# Patient Record
Sex: Female | Born: 1965 | Race: White | Hispanic: No | Marital: Married | Smoking: Current every day smoker
Health system: Southern US, Community
[De-identification: ages and names within clinical notes are randomized; demographics above are authoritative.]

## PROBLEM LIST (undated history)

## (undated) HISTORY — PX: TIBIA FRACTURE SURGERY: SHX806

---

## 2017-10-09 ENCOUNTER — Emergency Department (HOSPITAL_COMMUNITY)
Admission: EM | Admit: 2017-10-09 | Discharge: 2017-10-09 | Disposition: A | Discharge status: Home / Self Care | Payer: Medicaid Other | Attending: Emergency Medicine | Admitting: Emergency Medicine

## 2017-10-09 ENCOUNTER — Emergency Department (HOSPITAL_COMMUNITY): Payer: Medicaid Other

## 2017-10-09 ENCOUNTER — Encounter (HOSPITAL_COMMUNITY): Payer: Self-pay

## 2017-10-09 DIAGNOSIS — Y929 Unspecified place or not applicable: Secondary | ICD-10-CM | POA: Diagnosis not present

## 2017-10-09 DIAGNOSIS — W1789XA Other fall from one level to another, initial encounter: Secondary | ICD-10-CM | POA: Insufficient documentation

## 2017-10-09 DIAGNOSIS — S99912A Unspecified injury of left ankle, initial encounter: Secondary | ICD-10-CM | POA: Diagnosis present

## 2017-10-09 DIAGNOSIS — Y998 Other external cause status: Secondary | ICD-10-CM | POA: Insufficient documentation

## 2017-10-09 DIAGNOSIS — Y9389 Activity, other specified: Secondary | ICD-10-CM | POA: Diagnosis not present

## 2017-10-09 DIAGNOSIS — S82832A Other fracture of upper and lower end of left fibula, initial encounter for closed fracture: Secondary | ICD-10-CM | POA: Diagnosis not present

## 2017-10-09 DIAGNOSIS — F17200 Nicotine dependence, unspecified, uncomplicated: Secondary | ICD-10-CM | POA: Diagnosis not present

## 2017-10-09 DIAGNOSIS — S82892A Other fracture of left lower leg, initial encounter for closed fracture: Secondary | ICD-10-CM

## 2017-10-09 MED ORDER — HYDROCODONE-ACETAMINOPHEN 5-325 MG PO TABS
1.0000 | ORAL_TABLET | Freq: Once | ORAL | Status: AC
  Administered 2017-10-09: 1 via ORAL
  Filled 2017-10-09: qty 1

## 2017-10-09 MED ORDER — HYDROCODONE-ACETAMINOPHEN 5-325 MG PO TABS: 2.0000 | ORAL_TABLET | ORAL | 0 refills | Status: AC | PRN

## 2017-10-09 NOTE — ED Provider Notes (Signed)
[facility] Provider Note   CSN: [id number] Arrival date & time: 10/09/17  1745     History   Chief Complaint Chief Complaint  Patient presents with  . Ankle Injury    HPI Paula DammeMichelle Ferrell is a 52 y.o. female.  52 year old female without significant prior medical history presents with complaint of left ankle pain.  Patient reports that earlier this afternoon she jumped from a fence and landed hard on the left ankle.  She felt a pop and immediate pain to the left lateral ankle.  She denies any other injury.  She has not been able to ambulate on the ankle.  She denies prior fractures to the left ankle.  She denies numbness or tingling to the left leg.  She denies head injury, neck pain, chest pain, shortness of breath, or other complaint.  The history is provided by the patient.  Ankle Injury  This is a new problem. The current episode started 3 to 5 hours ago. The problem occurs constantly. The problem has not changed since onset.Pertinent negatives include no chest pain, no abdominal pain, no headaches and no shortness of breath. The symptoms are aggravated by standing. Nothing relieves the symptoms. She has tried nothing for the symptoms.    History reviewed. No pertinent past medical history.  There are no active problems to display for this patient.   History reviewed. No pertinent surgical history.   OB History   None      Home Medications    Prior to Admission medications   Not on File    Family History No family history on file.  Social History Social History   Tobacco Use  . Smoking status: Current Every Day Smoker  . Smokeless tobacco: Never Used  Substance Use Topics  . Alcohol use: Not on file  . Drug use: Not on file     Allergies   Patient has no known allergies.   Review of Systems Review of Systems  Respiratory: Negative for shortness of breath.   Cardiovascular: Negative for chest pain.    Gastrointestinal: Negative for abdominal pain.  Musculoskeletal:       Left ankle pain  Neurological: Negative for headaches.  All other systems reviewed and are negative.    Physical Exam Updated Vital Signs BP 130/74 (BP Location: Left Arm)   Pulse 73   Temp 98.5 F (36.9 C) (Oral)   Resp 20   SpO2 97%   Physical Exam  Constitutional: She is oriented to person, place, and time. She appears well-developed and well-nourished. No distress.  HENT:  Head: Normocephalic and atraumatic.  Mouth/Throat: Oropharynx is clear and moist.  Eyes: Pupils are equal, round, and reactive to light. Conjunctivae and EOM are normal.  Neck: Normal range of motion. Neck supple.  Cardiovascular: Normal rate, regular rhythm and normal heart sounds.  Pulmonary/Chest: Effort normal and breath sounds normal. No respiratory distress.  Abdominal: Soft. She exhibits no distension. There is no tenderness.  Musculoskeletal: Normal range of motion. She exhibits no edema or deformity.  Tenderness to the left lateral ankle at the lateral malleolus.  Distal left lower extremity is neurovascularly intact.  Mild ecchymosis noted to the left lateral malleolus.  Neurological: She is alert and oriented to person, place, and time.  Skin: Skin is warm and dry.  Psychiatric: She has a normal mood and affect.  Nursing note and vitals reviewed.    ED Treatments / Results  Labs (all labs ordered are listed,  but only abnormal results are displayed) Labs Reviewed - No data to display  EKG None  Radiology Dg Ankle Complete Left  Result Date: 10/09/2017 CLINICAL DATA:  Ankle injury EXAM: LEFT ANKLE COMPLETE - 3+ VIEW COMPARISON:  None. FINDINGS: Moderate soft tissue swelling over the lateral malleolus. There is a minimally displaced, comminuted fracture of the distal left fibula. No tibial fracture. Talus is intact. No dislocation. IMPRESSION: Minimally displaced and comminuted fracture of the distal left fibula with  overlying soft tissue swelling. Electronically Signed   By: Deatra Robinson M.D.   On: 10/09/2017 19:02    Procedures Procedures (including critical care time)  Medications Ordered in ED Medications  HYDROcodone-acetaminophen (NORCO/VICODIN) 5-325 MG per tablet 1 tablet (has no administration in time range)     Initial Impression / Assessment and Plan / ED Course  I have reviewed the triage vital signs and the nursing notes.  Pertinent labs & imaging results that were available during my care of the patient were reviewed by me and considered in my medical decision making (see chart for details).     MDM  Screen complete  Patient presenting with left distal fibula fracture.  Patient will be treated with splinting and crutches.  Will discharge with instructions for follow-up with Ortho.  Patient understands need for close follow-up.  Strict return precautions given and understood.  Final Clinical Impressions(s) / ED Diagnoses   Final diagnoses:  Closed fracture of left ankle, initial encounter    ED Discharge Orders        Ordered    HYDROcodone-acetaminophen (NORCO/VICODIN) 5-325 MG tablet  Every 4 hours PRN     10/09/17 2137       Wynetta Fines, MD 10/09/17 2138

## 2017-10-09 NOTE — Progress Notes (Signed)
Orthopedic Tech Progress Note Patient Details:  Paula Ferrell 11/22/1965 [id number]  Ortho Devices Type of Ortho Device: Ace wrap, Crutches Ortho Device/Splint Location: LLE Ortho Device/Splint Interventions: Ordered, Application, Adjustment   Post Interventions Patient Tolerated: Well Instructions Provided: Care of device   Jennye MoccasinHughes, Rigel Filsinger Craig 10/09/2017, 9:52 PM

## 2017-10-09 NOTE — ED Triage Notes (Signed)
Patient jumped off of 10 foot fence and heard ankle crack. Swelling noted to same, positive distal pulses. Pain with any ambulation

## 2019-02-10 ENCOUNTER — Emergency Department (HOSPITAL_BASED_OUTPATIENT_CLINIC_OR_DEPARTMENT_OTHER)
Admission: EM | Admit: 2019-02-10 | Discharge: 2019-02-10 | Disposition: A | Discharge status: Home / Self Care | Payer: Medicaid Other | Attending: Emergency Medicine | Admitting: Emergency Medicine

## 2019-02-10 ENCOUNTER — Other Ambulatory Visit: Payer: Self-pay

## 2019-02-10 ENCOUNTER — Encounter (HOSPITAL_BASED_OUTPATIENT_CLINIC_OR_DEPARTMENT_OTHER): Payer: Self-pay

## 2019-02-10 ENCOUNTER — Emergency Department (HOSPITAL_BASED_OUTPATIENT_CLINIC_OR_DEPARTMENT_OTHER): Payer: Medicaid Other

## 2019-02-10 DIAGNOSIS — Y999 Unspecified external cause status: Secondary | ICD-10-CM | POA: Insufficient documentation

## 2019-02-10 DIAGNOSIS — F17210 Nicotine dependence, cigarettes, uncomplicated: Secondary | ICD-10-CM | POA: Diagnosis not present

## 2019-02-10 DIAGNOSIS — Y9389 Activity, other specified: Secondary | ICD-10-CM | POA: Insufficient documentation

## 2019-02-10 DIAGNOSIS — S6391XA Sprain of unspecified part of right wrist and hand, initial encounter: Secondary | ICD-10-CM | POA: Diagnosis not present

## 2019-02-10 DIAGNOSIS — Y9289 Other specified places as the place of occurrence of the external cause: Secondary | ICD-10-CM | POA: Diagnosis not present

## 2019-02-10 DIAGNOSIS — S63501A Unspecified sprain of right wrist, initial encounter: Secondary | ICD-10-CM

## 2019-02-10 DIAGNOSIS — W19XXXA Unspecified fall, initial encounter: Secondary | ICD-10-CM | POA: Insufficient documentation

## 2019-02-10 DIAGNOSIS — S6991XA Unspecified injury of right wrist, hand and finger(s), initial encounter: Secondary | ICD-10-CM | POA: Diagnosis present

## 2019-02-10 NOTE — ED Triage Notes (Signed)
Pt states she fell yesterday-pain to right wrist-NAD-steady gait

## 2019-02-11 NOTE — ED Provider Notes (Signed)
Emergency Department Provider Note   I have reviewed the triage vital signs and the nursing notes.   HISTORY  Chief Complaint Wrist Injury   HPI Paula Ferrell is a 53 y.o. female who presents the emergency department today a day after falling and hurting her wrist.  Patient states she fell on outstretched hand yesterday has pain to her right lateral wrist.  No deformities.  She tried some ice and ibuprofen but wants to make sure is not fractured.   No other associated or modifying symptoms.    History reviewed. No pertinent past medical history.  There are no active problems to display for this patient.   Past Surgical History:  Procedure Laterality Date  . TIBIA FRACTURE SURGERY      Current Outpatient Rx  . Order #: [id number] Class: Print    Allergies Patient has no known allergies.  No family history on file.  Social History Social History   Tobacco Use  . Smoking status: Current Every Day Smoker    Types: Cigarettes  . Smokeless tobacco: Never Used  Substance Use Topics  . Alcohol use: Never    Frequency: Never  . Drug use: Never    Review of Systems  All other systems negative except as documented in the HPI. All pertinent positives and negatives as reviewed in the HPI. ____________________________________________   PHYSICAL EXAM:  VITAL SIGNS: ED Triage Vitals  Enc Vitals Group     BP 02/10/19 2049 133/86     Pulse Rate 02/10/19 2049 84     Resp 02/10/19 2049 18     Temp 02/10/19 2049 98.3 F (36.8 C)     Temp Source 02/10/19 2049 Oral     SpO2 02/10/19 2049 100 %     Weight 02/10/19 2049 150 lb (68 kg)     Height 02/10/19 2049 5\' 5"  (1.651 m)     Head Circumference --      Peak Flow --      Pain Score 02/10/19 2055 0     Pain Loc --      Pain Edu? --      Excl. in GC? --     Constitutional: Alert and oriented. Well appearing and in no acute distress. Eyes: Conjunctivae are normal. PERRL. EOMI. Head: Atraumatic. Nose: No  congestion/rhinnorhea. Mouth/Throat: Mucous membranes are moist.  Oropharynx non-erythematous. Neck: No stridor.  No meningeal signs.   Cardiovascular: Normal rate, regular rhythm. Good peripheral circulation. Grossly normal heart sounds.   Respiratory: Normal respiratory effort.  No retractions. Lungs CTAB. Gastrointestinal: Soft and nontender. No distention.  Musculoskeletal: No lower extremity tenderness nor edema. No gross deformities of extremities.  Mild tenderness with range of motion of the wrist but no scaphoid tenderness or obvious deformity. Neurologic:  Normal speech and language. No gross focal neurologic deficits are appreciated.  Skin:  Skin is warm, dry and intact. No rash noted.   ____________________________________________   RADIOLOGY  Dg Wrist Complete Right  Result Date: 02/10/2019 CLINICAL DATA:  Fall yesterday with wrist pain. EXAM: RIGHT WRIST - COMPLETE 3+ VIEW COMPARISON:  None. FINDINGS: There is no evidence of fracture or dislocation. There is no evidence of arthropathy or other focal bone abnormality. Soft tissues are unremarkable. IMPRESSION: Negative. Electronically Signed   By: Paulina FusiMark  Shogry M.D.   On: 02/10/2019 22:30    ____________________________________________   PROCEDURES  Procedure(s) performed:   Procedures   ____________________________________________   INITIAL IMPRESSION / ASSESSMENT AND PLAN / ED COURSE  Likely  wrist sprain doubt fracture but talked to her about the possibility of a missed fracture.  We will put in a splint for comfort.  NSAIDs at home.     Pertinent labs & imaging results that were available during my care of the patient were reviewed by me and considered in my medical decision making (see chart for details).  A medical screening exam was performed and I feel the patient has had an appropriate workup for their chief complaint at this time and likelihood of emergent condition existing is low. They have been  counseled on decision, discharge, follow up and which symptoms necessitate immediate return to the emergency department. They or their family verbally stated understanding and agreement with plan and discharged in stable condition.   ____________________________________________  FINAL CLINICAL IMPRESSION(S) / ED DIAGNOSES  Final diagnoses:  Sprain of right wrist, initial encounter     MEDICATIONS GIVEN DURING THIS VISIT:  Medications - No data to display   NEW OUTPATIENT MEDICATIONS STARTED DURING THIS VISIT:  Discharge Medication List as of 02/10/2019 11:13 PM      Note:  This note was prepared with assistance of Dragon voice recognition software. Occasional wrong-word or sound-a-like substitutions may have occurred due to the inherent limitations of voice recognition software.   Yedidya Duddy, Corene Cornea, MD 02/11/19 [phone]

## 2021-03-04 IMAGING — CR RIGHT WRIST - COMPLETE 3+ VIEW
4 series · 4 of 4 positions shown · non-contrast
Comparison: None.

CLINICAL DATA: Fall yesterday with wrist pain.

EXAM:
RIGHT WRIST - COMPLETE 3+ VIEW

[x navicular]
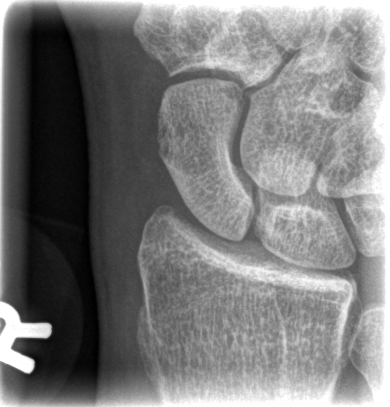

[x wrist pa right]
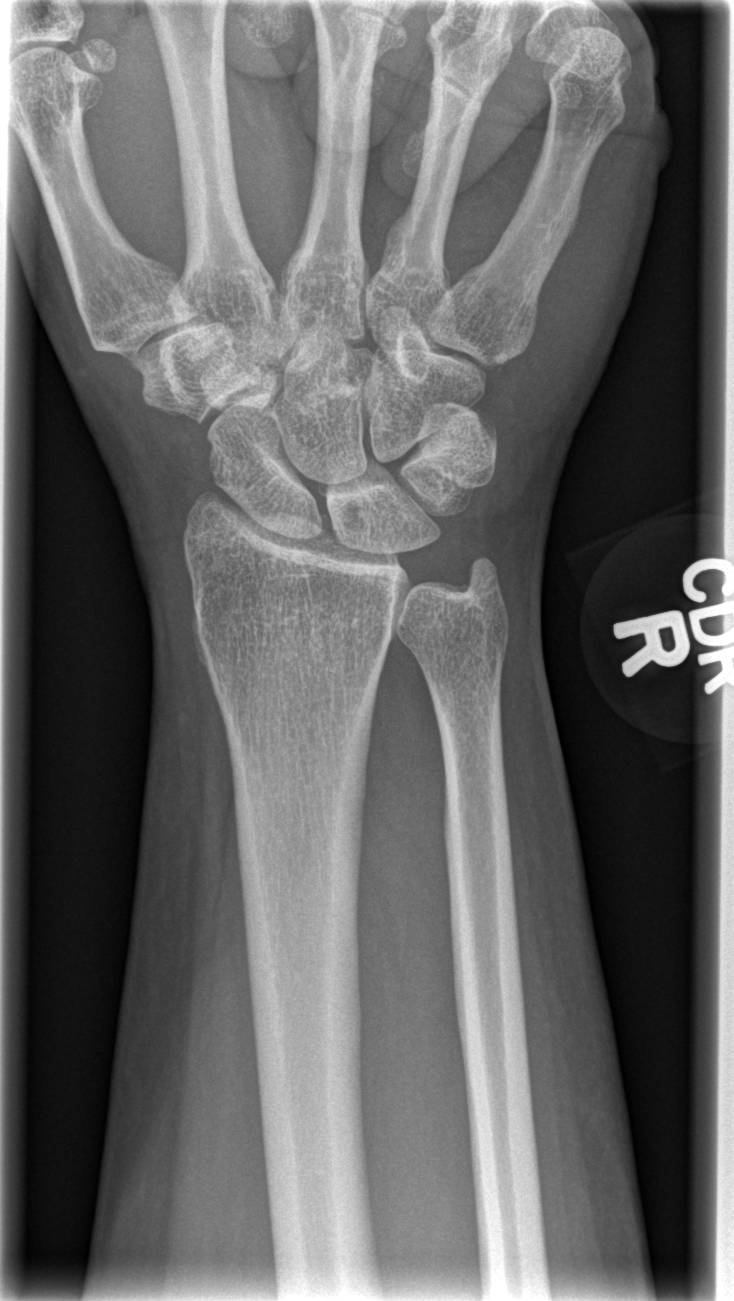

[x wrist obl right]
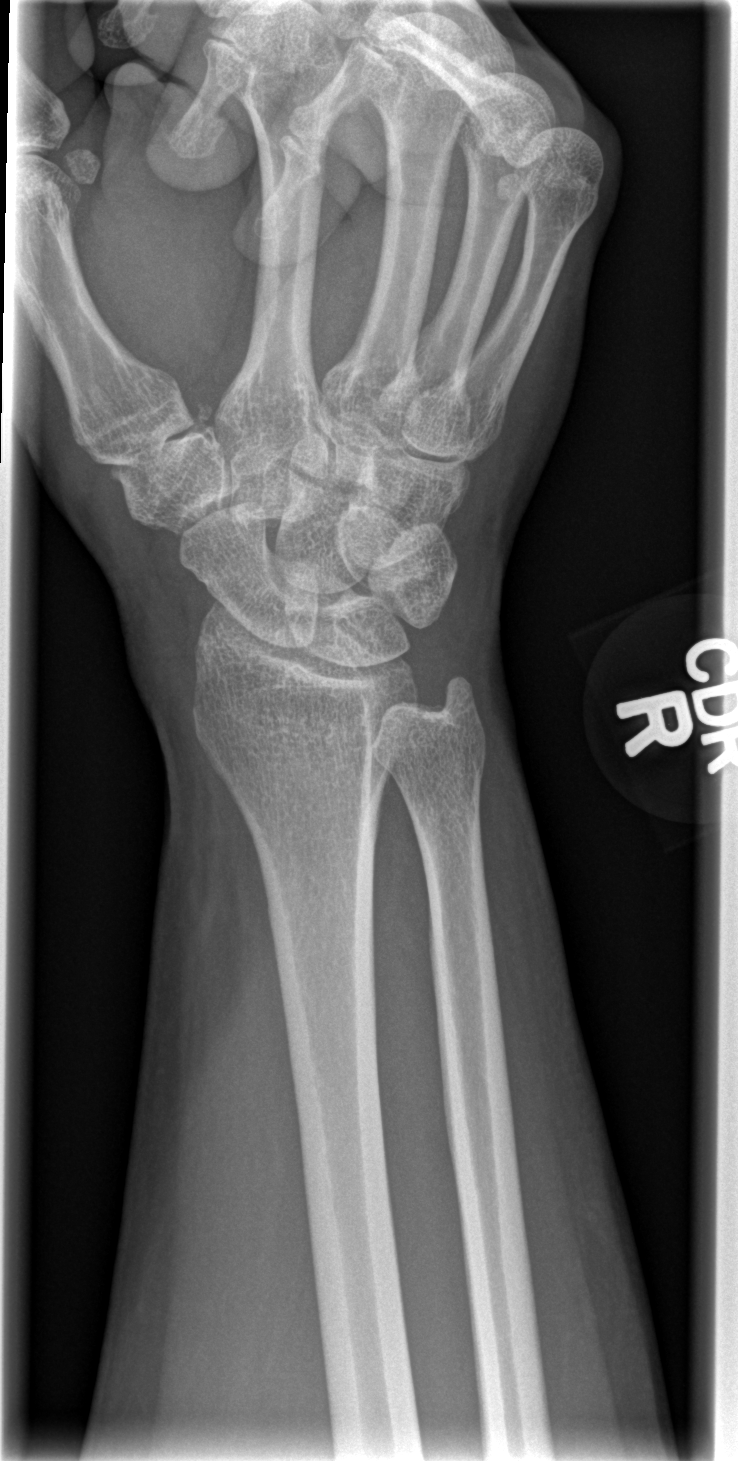

[x wrist lat right]
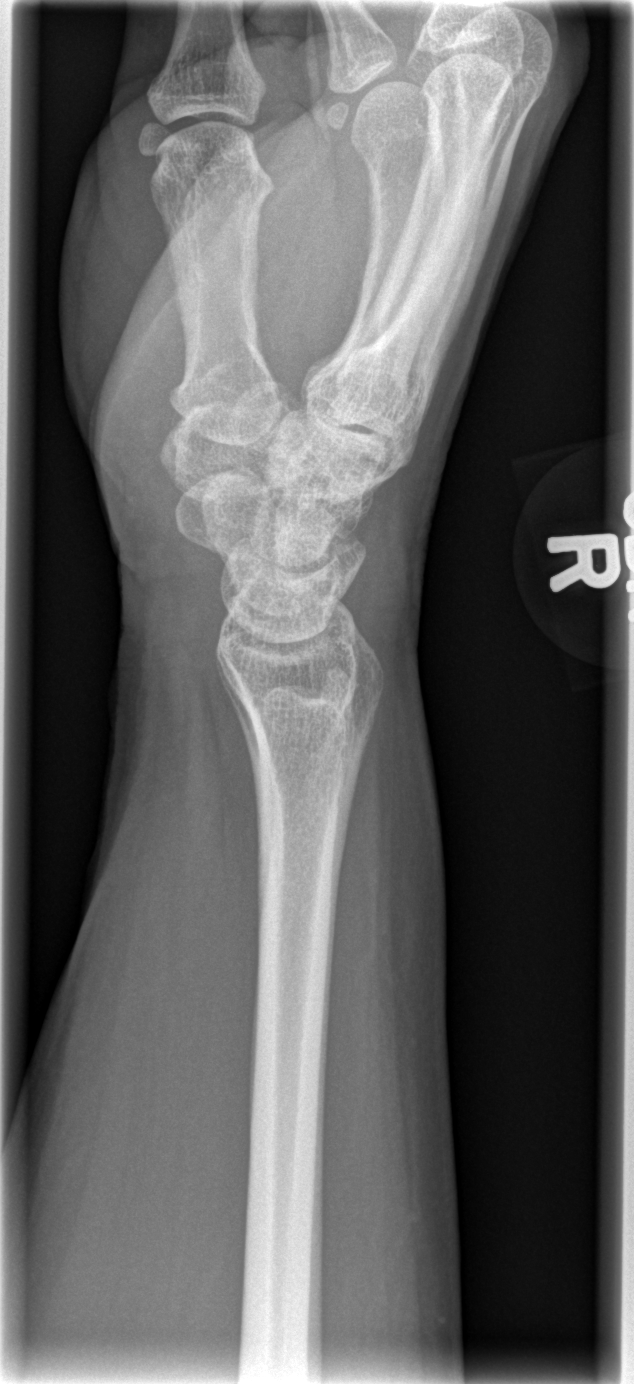

[4 of 4 positions shown; findings below may reference images not displayed]

FINDINGS: There is no evidence of fracture or dislocation. There is no
evidence of arthropathy or other focal bone abnormality. Soft
tissues are unremarkable.
IMPRESSION: Negative.

## 2022-01-12 ENCOUNTER — Ambulatory Visit: Payer: Self-pay
